# Patient Record
Sex: Female | Born: 1990 | Race: White | Hispanic: No | Marital: Single | Smoking: Never smoker
Health system: Southern US, Community
[De-identification: ages and names within clinical notes are randomized; demographics above are authoritative.]

---
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
## 2009-01-12 ENCOUNTER — Emergency Department (HOSPITAL_COMMUNITY): Admission: EM | Admit: 2009-01-12 | Discharge: 2009-01-12 | Payer: Self-pay | Admitting: Emergency Medicine

## 2010-02-12 ENCOUNTER — Encounter: Admission: RE | Admit: 2010-02-12 | Discharge: 2010-02-12 | Payer: Self-pay | Admitting: Family Medicine

## 2011-02-11 LAB — POCT PREGNANCY, URINE: Preg Test, Ur: NEGATIVE

## 2011-02-11 LAB — URINALYSIS, ROUTINE W REFLEX MICROSCOPIC
Bilirubin Urine: NEGATIVE
Glucose, UA: NEGATIVE mg/dL
Hgb urine dipstick: NEGATIVE
Ketones, ur: NEGATIVE mg/dL
Nitrite: NEGATIVE
Protein, ur: NEGATIVE mg/dL
Specific Gravity, Urine: 1.014 (ref 1.005–1.030)
Urobilinogen, UA: 1 mg/dL (ref 0.0–1.0)
pH: 6.5 (ref 5.0–8.0)

## 2011-07-07 ENCOUNTER — Other Ambulatory Visit (HOSPITAL_COMMUNITY)
Admission: RE | Admit: 2011-07-07 | Discharge: 2011-07-07 | Disposition: A | Discharge status: Home / Self Care | Payer: 59 | Source: Ambulatory Visit | Attending: Family Medicine | Admitting: Family Medicine

## 2011-07-07 DIAGNOSIS — Z113 Encounter for screening for infections with a predominantly sexual mode of transmission: Secondary | ICD-10-CM | POA: Insufficient documentation

## 2011-07-07 DIAGNOSIS — Z0000 Encounter for general adult medical examination without abnormal findings: Secondary | ICD-10-CM | POA: Insufficient documentation

## 2013-11-15 ENCOUNTER — Other Ambulatory Visit: Payer: Self-pay | Admitting: Family Medicine

## 2013-11-15 ENCOUNTER — Other Ambulatory Visit (HOSPITAL_COMMUNITY)
Admission: RE | Admit: 2013-11-15 | Discharge: 2013-11-15 | Disposition: A | Discharge status: Home / Self Care | Payer: 59 | Source: Ambulatory Visit | Attending: Family Medicine | Admitting: Family Medicine

## 2013-11-15 DIAGNOSIS — Z113 Encounter for screening for infections with a predominantly sexual mode of transmission: Secondary | ICD-10-CM | POA: Insufficient documentation

## 2013-11-15 DIAGNOSIS — Z0000 Encounter for general adult medical examination without abnormal findings: Secondary | ICD-10-CM | POA: Insufficient documentation

## 2014-02-21 ENCOUNTER — Emergency Department (HOSPITAL_BASED_OUTPATIENT_CLINIC_OR_DEPARTMENT_OTHER): Payer: BC Managed Care – PPO

## 2014-02-21 ENCOUNTER — Emergency Department (HOSPITAL_BASED_OUTPATIENT_CLINIC_OR_DEPARTMENT_OTHER)
Admission: EM | Admit: 2014-02-21 | Discharge: 2014-02-21 | Disposition: A | Discharge status: Home / Self Care | Payer: BC Managed Care – PPO | Attending: Emergency Medicine | Admitting: Emergency Medicine

## 2014-02-21 ENCOUNTER — Encounter (HOSPITAL_BASED_OUTPATIENT_CLINIC_OR_DEPARTMENT_OTHER): Payer: Self-pay | Admitting: Emergency Medicine

## 2014-02-21 DIAGNOSIS — Y9389 Activity, other specified: Secondary | ICD-10-CM | POA: Insufficient documentation

## 2014-02-21 DIAGNOSIS — W010XXA Fall on same level from slipping, tripping and stumbling without subsequent striking against object, initial encounter: Secondary | ICD-10-CM | POA: Insufficient documentation

## 2014-02-21 DIAGNOSIS — Y929 Unspecified place or not applicable: Secondary | ICD-10-CM | POA: Insufficient documentation

## 2014-02-21 DIAGNOSIS — S93609A Unspecified sprain of unspecified foot, initial encounter: Secondary | ICD-10-CM | POA: Insufficient documentation

## 2014-02-21 DIAGNOSIS — S93602A Unspecified sprain of left foot, initial encounter: Secondary | ICD-10-CM

## 2014-02-21 NOTE — Discharge Instructions (Signed)
Foot Sprain The muscles and cord like structures which attach muscle to bone (tendons) that surround the feet are made up of units. A foot sprain can occur at the weakest spot in any of these units. This condition is most often caused by injury to or overuse of the foot, as from playing contact sports, or aggravating a previous injury, or from poor conditioning, or obesity. SYMPTOMS  Pain with movement of the foot.  Tenderness and swelling at the injury site.  Loss of strength is present in moderate or severe sprains. THE THREE GRADES OR SEVERITY OF FOOT SPRAIN ARE:  Mild (Grade I): Slightly pulled muscle without tearing of muscle or tendon fibers or loss of strength.  Moderate (Grade II): Tearing of fibers in a muscle, tendon, or at the attachment to bone, with small decrease in strength.  Severe (Grade III): Rupture of the muscle-tendon-bone attachment, with separation of fibers. Severe sprain requires surgical repair. Often repeating (chronic) sprains are caused by overuse. Sudden (acute) sprains are caused by direct injury or over-use. DIAGNOSIS  Diagnosis of this condition is usually by your own observation. If problems continue, a caregiver may be required for further evaluation and treatment. X-rays may be required to make sure there are not breaks in the bones (fractures) present. Continued problems may require physical therapy for treatment. PREVENTION  Use strength and conditioning exercises appropriate for your sport.  Warm up properly prior to working out.  Use athletic shoes that are made for the sport you are participating in.  Allow adequate time for healing. Early return to activities makes repeat injury more likely, and can lead to an unstable arthritic foot that can result in prolonged disability. Mild sprains generally heal in 3 to 10 days, with moderate and severe sprains taking 2 to 10 weeks. Your caregiver can help you determine the proper time required for  healing. HOME CARE INSTRUCTIONS   Apply ice to the injury for 15-20 minutes, 03-04 times per day. Put the ice in a plastic bag and place a towel between the bag of ice and your skin.  An elastic wrap (like an Ace bandage) may be used to keep swelling down.  Keep foot above the level of the heart, or at least raised on a footstool, when swelling and pain are present.  Try to avoid use other than gentle range of motion while the foot is painful. Do not resume use until instructed by your caregiver. Then begin use gradually, not increasing use to the point of pain. If pain does develop, decrease use and continue the above measures, gradually increasing activities that do not cause discomfort, until you gradually achieve normal use.  Use crutches if and as instructed, and for the length of time instructed.  Keep injured foot and ankle wrapped between treatments.  Massage foot and ankle for comfort and to keep swelling down. Massage from the toes up towards the knee.  Only take over-the-counter or prescription medicines for pain, discomfort, or fever as directed by your caregiver. SEEK IMMEDIATE MEDICAL CARE IF:   Your pain and swelling increase, or pain is not controlled with medications.  You have loss of feeling in your foot or your foot turns cold or blue.  You develop new, unexplained symptoms, or an increase of the symptoms that brought you to your caregiver. MAKE SURE YOU:   Understand these instructions.  Will watch your condition.  Will get help right away if you are not doing well or get worse. Document Released:   04/09/2002 Document Revised: 01/10/2012 Document Reviewed: 06/06/2008 [employer] Patient Information 2014 [employer].  

## 2014-02-21 NOTE — ED Notes (Signed)
Tripped on a cement step. Injury to her left foot. Swelling and pain.

## 2014-02-21 NOTE — ED Notes (Signed)
D/c home with ice pack for home use-

## 2014-02-21 NOTE — ED Provider Notes (Signed)
CSN: [id number]     Arrival date & time 02/21/14  1912 History  This chart was scribed for Isabella Jolliffe B. Bernette MayersSheldon, MD by Charline BillsEssence Howell, ED Scribe. The patient was seen in room MH02/MH02. Patient's care was started at 7:56 PM.    Chief Complaint  Patient presents with  . Foot Pain    The history is provided by the patient. No language interpreter was used.   HPI Comments: Isabella Andrade is a 23 y.o. female who presents to the Emergency Department complaining of constant L foot pain. Pt states that she tripped and fell on a cement step yesterday but did not have immediate pain. Pain is worse with walking.   History reviewed. No pertinent past medical history. History reviewed. No pertinent past surgical history. No family history on file. History  Substance Use Topics  . Smoking status: Never Smoker   . Smokeless tobacco: Not on file  . Alcohol Use: Yes   OB History   Grav Para Term Preterm Abortions TAB SAB Ect Mult Living                 Review of Systems  Musculoskeletal: Positive for myalgias.  All other systems reviewed and are negative.   Allergies  Review of patient's allergies indicates no known allergies.  Home Medications   Prior to Admission medications   Not on File   Triage Vitals: BP 124/81  Pulse 89  Temp(Src) 97.9 F (36.6 C) (Oral)  Resp 18  Ht 5' 3.5" (1.613 m)  Wt 105 lb (47.628 kg)  BMI 18.31 kg/m2  SpO2 100%  LMP 01/31/2014 Physical Exam  Constitutional: She is oriented to person, place, and time. She appears well-developed and well-nourished.  HENT:  Head: Normocephalic and atraumatic.  Neck: Neck supple.  Pulmonary/Chest: Effort normal.  Musculoskeletal: She exhibits tenderness (Dorsal L foot).  No bony tenderness No deformity Neurovascularly intact  Neurological: She is alert and oriented to person, place, and time. No cranial nerve deficit.  Psychiatric: She has a normal mood and affect. Her behavior is normal.    ED Course   Procedures (including critical care time) DIAGNOSTIC STUDIES: Oxygen Saturation is 100% on RA, normal by my interpretation.    COORDINATION OF CARE: 7:57 PM-Discussed treatment plan which includes anti-inflammatory and with pt at bedside and pt agreed to plan.   Labs Review Labs Reviewed - No data to display  Imaging Review Dg Foot Complete Left  02/21/2014   CLINICAL DATA:  Left foot pain, fall, metatarsal pain  EXAM: LEFT FOOT - COMPLETE 3+ VIEW  COMPARISON:  02/12/2010  FINDINGS: There is no evidence of fracture or dislocation. There is no evidence of arthropathy or other focal bone abnormality. Soft tissues are unremarkable.  IMPRESSION: No acute osseous finding   Electronically Signed   By: Ruel Favorsrevor  Shick M.D.   On: 02/21/2014 19:52     EKG Interpretation None      MDM   Final diagnoses:  Sprain of left foot    Xray neg. Likely foot sprain. Post-op shoe, rest, ice elevation.   I personally performed the services described in this documentation, which was scribed in my presence. The recorded information has been reviewed and is accurate.    Praneel Haisley B. Bernette MayersSheldon, MD 02/21/14 2023

## 2015-01-08 IMAGING — CR DG FOOT COMPLETE 3+V*L*
3 series · 3 of 3 positions shown · non-contrast
Comparison: 02/12/2010

CLINICAL DATA: Left foot pain, fall, metatarsal pain

EXAM:
LEFT FOOT - COMPLETE 3+ VIEW

[t foot ap left]
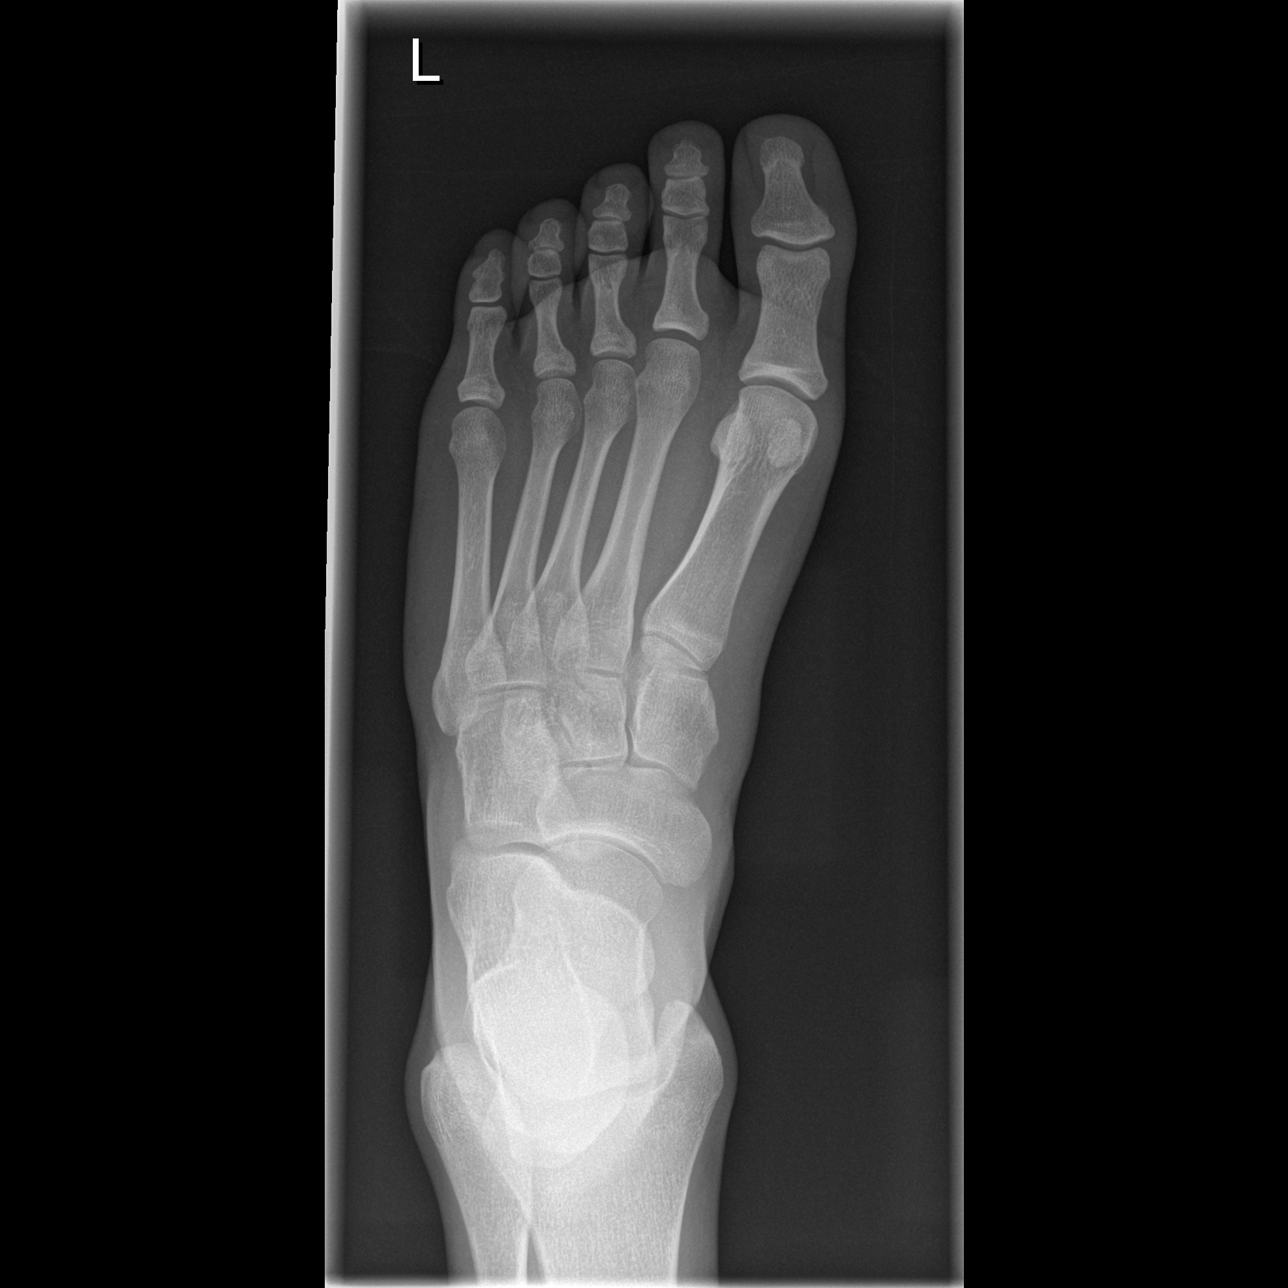

[t foot oblique left]
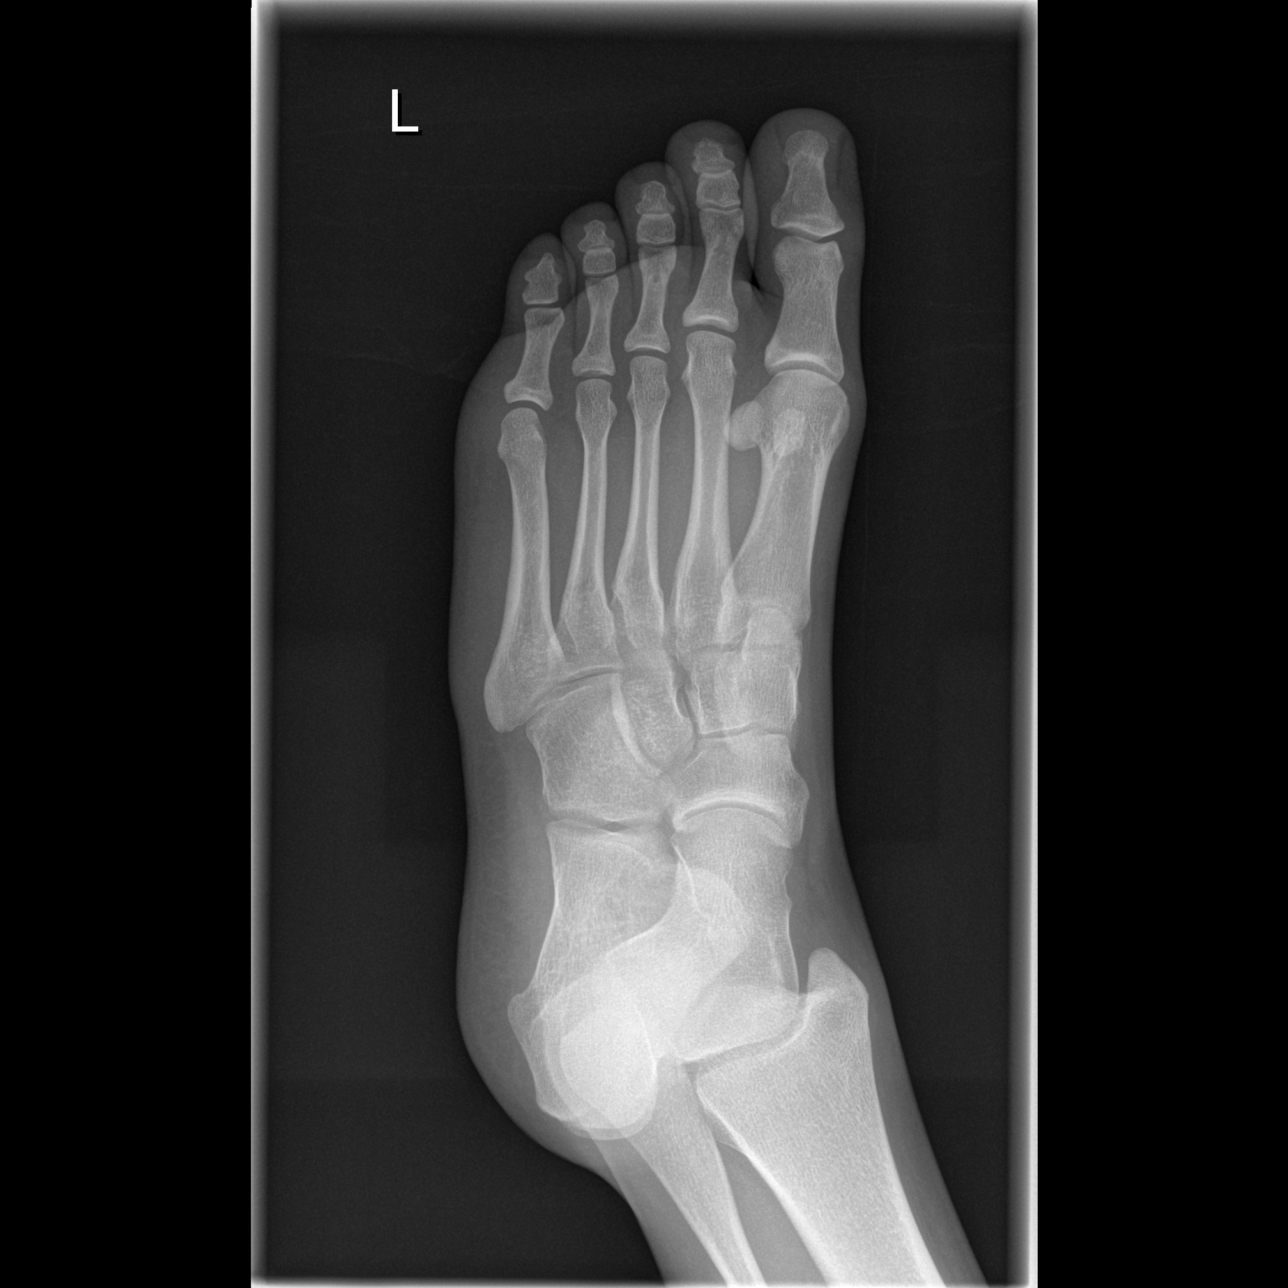

[t foot lat left]
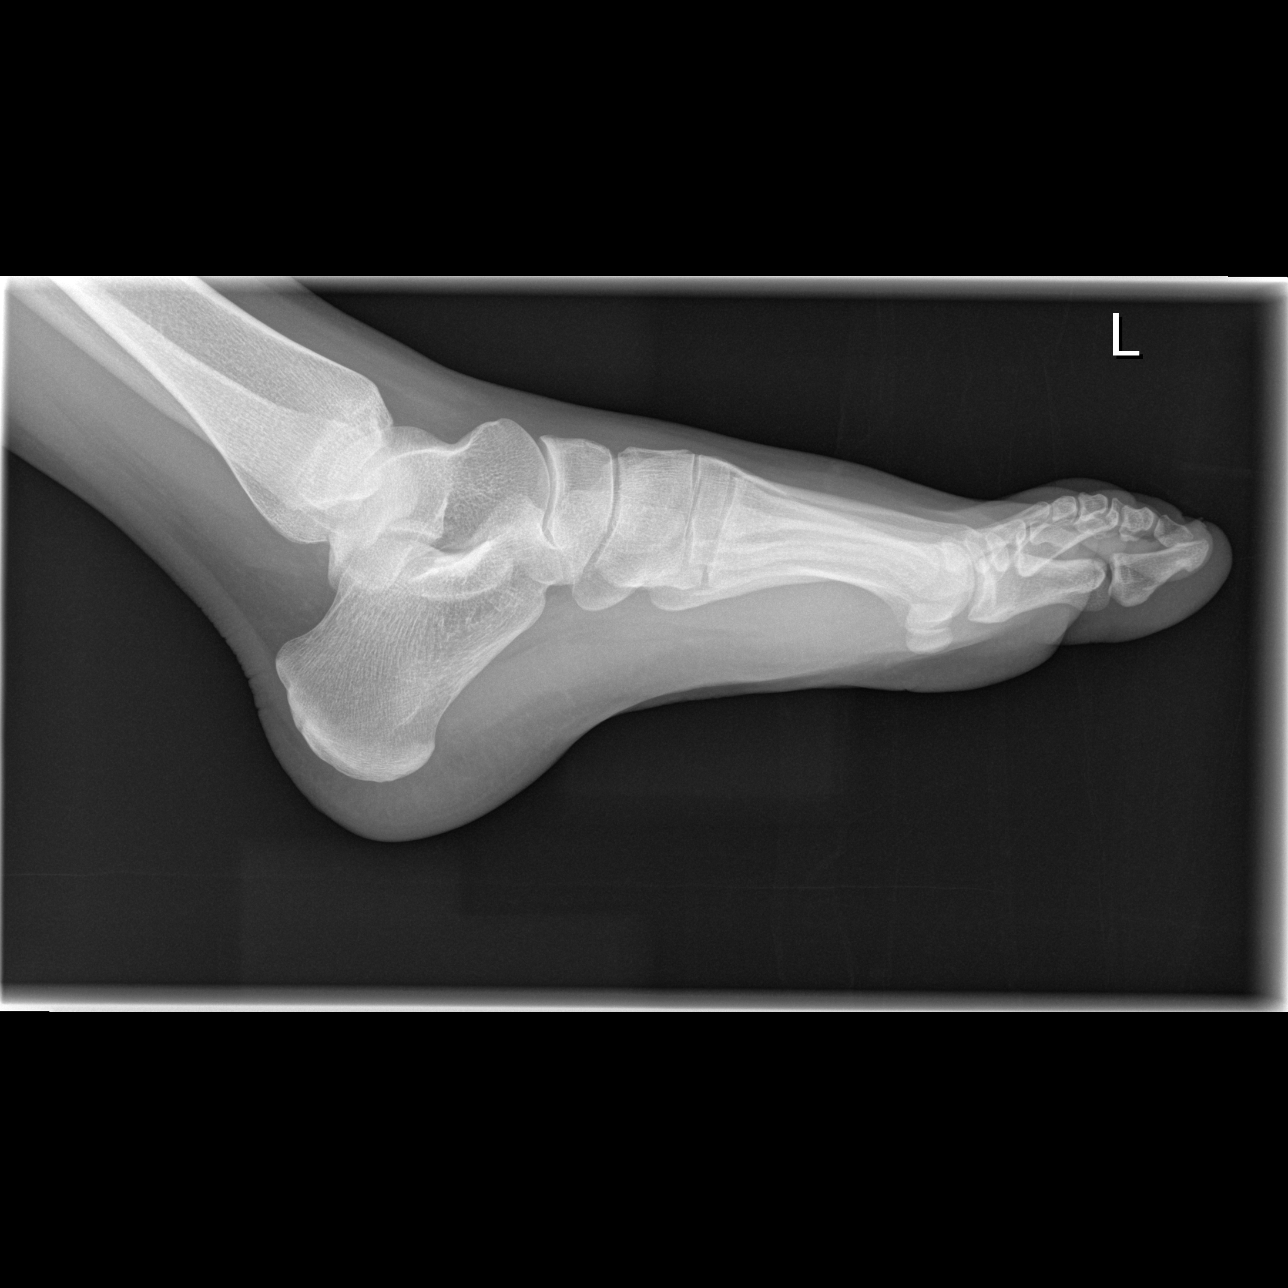

[3 of 3 positions shown; findings below may reference images not displayed]

FINDINGS: There is no evidence of fracture or dislocation. There is no
evidence of arthropathy or other focal bone abnormality. Soft
tissues are unremarkable.
IMPRESSION: No acute osseous finding

## 2016-04-19 DIAGNOSIS — F419 Anxiety disorder, unspecified: Secondary | ICD-10-CM | POA: Diagnosis not present

## 2016-04-19 DIAGNOSIS — F902 Attention-deficit hyperactivity disorder, combined type: Secondary | ICD-10-CM | POA: Diagnosis not present

## 2016-04-19 DIAGNOSIS — N946 Dysmenorrhea, unspecified: Secondary | ICD-10-CM | POA: Diagnosis not present

## 2016-08-05 ENCOUNTER — Other Ambulatory Visit (HOSPITAL_COMMUNITY)
Admission: RE | Admit: 2016-08-05 | Discharge: 2016-08-05 | Disposition: A | Discharge status: Home / Self Care | Payer: BLUE CROSS/BLUE SHIELD | Source: Ambulatory Visit | Attending: Family Medicine | Admitting: Family Medicine

## 2016-08-05 ENCOUNTER — Other Ambulatory Visit: Payer: Self-pay | Admitting: Family Medicine

## 2016-08-05 DIAGNOSIS — Z23 Encounter for immunization: Secondary | ICD-10-CM | POA: Diagnosis not present

## 2016-08-05 DIAGNOSIS — Z124 Encounter for screening for malignant neoplasm of cervix: Secondary | ICD-10-CM | POA: Diagnosis not present

## 2016-08-05 DIAGNOSIS — F902 Attention-deficit hyperactivity disorder, combined type: Secondary | ICD-10-CM | POA: Diagnosis not present

## 2016-08-05 DIAGNOSIS — N946 Dysmenorrhea, unspecified: Secondary | ICD-10-CM | POA: Diagnosis not present

## 2016-08-05 DIAGNOSIS — Z01419 Encounter for gynecological examination (general) (routine) without abnormal findings: Secondary | ICD-10-CM | POA: Insufficient documentation

## 2016-08-05 DIAGNOSIS — Z113 Encounter for screening for infections with a predominantly sexual mode of transmission: Secondary | ICD-10-CM | POA: Insufficient documentation

## 2016-08-05 DIAGNOSIS — F4010 Social phobia, unspecified: Secondary | ICD-10-CM | POA: Diagnosis not present

## 2016-08-05 DIAGNOSIS — Z13220 Encounter for screening for lipoid disorders: Secondary | ICD-10-CM | POA: Diagnosis not present

## 2016-08-05 DIAGNOSIS — Z0000 Encounter for general adult medical examination without abnormal findings: Secondary | ICD-10-CM | POA: Diagnosis not present

## 2016-08-09 LAB — CYTOLOGY - PAP

## 2016-09-06 DIAGNOSIS — F419 Anxiety disorder, unspecified: Secondary | ICD-10-CM | POA: Diagnosis not present

## 2016-09-27 DIAGNOSIS — S6992XA Unspecified injury of left wrist, hand and finger(s), initial encounter: Secondary | ICD-10-CM | POA: Diagnosis not present

## 2016-11-11 DIAGNOSIS — R509 Fever, unspecified: Secondary | ICD-10-CM | POA: Diagnosis not present

## 2016-11-11 DIAGNOSIS — J101 Influenza due to other identified influenza virus with other respiratory manifestations: Secondary | ICD-10-CM | POA: Diagnosis not present

## 2016-11-22 DIAGNOSIS — F902 Attention-deficit hyperactivity disorder, combined type: Secondary | ICD-10-CM | POA: Diagnosis not present

## 2016-11-22 DIAGNOSIS — F419 Anxiety disorder, unspecified: Secondary | ICD-10-CM | POA: Diagnosis not present

## 2017-02-16 DIAGNOSIS — F419 Anxiety disorder, unspecified: Secondary | ICD-10-CM | POA: Diagnosis not present

## 2017-02-16 DIAGNOSIS — N946 Dysmenorrhea, unspecified: Secondary | ICD-10-CM | POA: Diagnosis not present

## 2017-02-16 DIAGNOSIS — F902 Attention-deficit hyperactivity disorder, combined type: Secondary | ICD-10-CM | POA: Diagnosis not present

## 2018-04-18 DIAGNOSIS — R0789 Other chest pain: Secondary | ICD-10-CM | POA: Diagnosis not present

## 2018-04-18 DIAGNOSIS — S29011A Strain of muscle and tendon of front wall of thorax, initial encounter: Secondary | ICD-10-CM | POA: Diagnosis not present
